# Patient Record
Sex: Male | Born: 1965 | Race: White | Hispanic: No | Smoking: Never smoker
Health system: Southern US, Community
[De-identification: ages and names within clinical notes are randomized; demographics above are authoritative.]

## PROBLEM LIST (undated history)

## (undated) DIAGNOSIS — G4700 Insomnia, unspecified: Secondary | ICD-10-CM

## (undated) DIAGNOSIS — R569 Unspecified convulsions: Secondary | ICD-10-CM

## (undated) DIAGNOSIS — M5126 Other intervertebral disc displacement, lumbar region: Secondary | ICD-10-CM

## (undated) DIAGNOSIS — M1990 Unspecified osteoarthritis, unspecified site: Secondary | ICD-10-CM

## (undated) DIAGNOSIS — Z87442 Personal history of urinary calculi: Secondary | ICD-10-CM

## (undated) HISTORY — PX: VASECTOMY: SHX75

## (undated) HISTORY — PX: COLONOSCOPY W/ BIOPSIES AND POLYPECTOMY: SHX1376

## (undated) HISTORY — PX: KNEE ARTHROSCOPY: SHX127

## (undated) HISTORY — PX: WISDOM TOOTH EXTRACTION: SHX21

## (undated) HISTORY — PX: LITHOTRIPSY: SUR834

---

## 2018-12-04 ENCOUNTER — Other Ambulatory Visit: Payer: Self-pay | Admitting: Orthopedic Surgery

## 2018-12-06 NOTE — Pre-Procedure Instructions (Signed)
[facility][id number] - [place], [place] - [address] AT [facility] [address] [place] [id number] Phone: [phone] Fax: [phone]      Your procedure is scheduled on Thursday, July 16th.  Report to [facility] Main Entrance "A" at 8:30 A.M., and check in at the Admitting office.  Call this number if you have problems the morning of surgery:  [phone]  Call [phone] if you have any questions prior to your surgery date Monday-Friday 8am-4pm    Remember:  Do not eat after midnight the night before your surgery  You may drink clear liquids until 8:30 the morning of your surgery.   Clear liquids allowed are: Water, Non-Citrus Juices (without pulp), Carbonated Beverages, Clear Tea, Black Coffee Only, and Gatorade.  Please complete your PRE-SURGERY ENSURE that was provided to you by 8:30 A.M. the morning of surgery.  Please, if able, drink it in one setting. DO NOT SIP.    Take these medicines the morning of surgery with A SIP OF WATER: NONE  fluticasone (FLONASE)-as needed  As of today, STOP taking any Aspirin (unless otherwise instructed by your surgeon), Aleve, Naproxen, Ibuprofen, Motrin, Advil, Goody's, BC's, all herbal medications, fish oil, and all vitamins.    The Morning of Surgery  Do not wear jewelry.  Do not wear lotions, powders, or colognes, or deodorant  Do not shave 48 hours prior to surgery.  Men may shave face and neck.  Do not bring valuables to the hospital.  [facility] is not responsible for any belongings or valuables.  If you are a smoker, DO NOT Smoke 24 hours prior to surgery IF you wear a CPAP at night please bring your mask, tubing, and machine the morning of surgery   Remember that you must have someone to transport you home after your surgery, and remain with you for 24 hours if you are discharged the same day.   Contacts, glasses, hearing aids, dentures or bridgework may not be worn into surgery.     Leave your suitcase in the car.  After surgery it may be brought to your room.  For patients admitted to the hospital, discharge time will be determined by your treatment team.  Patients discharged the day of surgery will not be allowed to drive home.    Special instructions:   [place]- Preparing For Surgery  Before surgery, you can play an important role. Because skin is not sterile, your skin needs to be as free of germs as possible. You can reduce the number of germs on your skin by washing with CHG (chlorahexidine gluconate) Soap before surgery.  CHG is an antiseptic cleaner which kills germs and bonds with the skin to continue killing germs even after washing.    Oral Hygiene is also important to reduce your risk of infection.  Remember - BRUSH YOUR TEETH THE MORNING OF SURGERY WITH YOUR REGULAR TOOTHPASTE  Please do not use if you have an allergy to CHG or antibacterial soaps. If your skin becomes reddened/irritated stop using the CHG.  Do not shave (including legs and underarms) for at least 48 hours prior to first CHG shower. It is OK to shave your face.  Please follow these instructions carefully.   1. Shower the NIGHT BEFORE SURGERY and the MORNING OF SURGERY with CHG Soap.   2. If you chose to wash your hair, wash your hair first as usual with your normal shampoo.  3.  After you shampoo, rinse your hair and body thoroughly to remove the shampoo.  4. Use CHG as you would any other liquid soap. You can apply CHG directly to the skin and wash gently with a scrungie or a clean washcloth.   5. Apply the CHG Soap to your body ONLY FROM THE NECK DOWN.  Do not use on open wounds or open sores. Avoid contact with your eyes, ears, mouth and genitals (private parts). Wash Face and genitals (private parts)  with your normal soap.   6. Wash thoroughly, paying special attention to the area where your surgery will be performed.  7. Thoroughly rinse your body with warm water from  the neck down.  8. DO NOT shower/wash with your normal soap after using and rinsing off the CHG Soap.  9. Pat yourself dry with a CLEAN TOWEL.  10. Wear CLEAN PAJAMAS to bed the night before surgery, wear comfortable clothes the morning of surgery  11. Place CLEAN SHEETS on your bed the night of your first shower and DO NOT SLEEP WITH PETS.    Day of Surgery:  Do not apply any deodorants/lotions. Please shower the morning of surgery with the CHG soap  Please wear clean clothes to the hospital/surgery center.   Remember to brush your teeth WITH YOUR REGULAR TOOTHPASTE.   Please read over the following fact sheets that you were given.

## 2018-12-09 ENCOUNTER — Encounter (HOSPITAL_COMMUNITY)
Admission: RE | Admit: 2018-12-09 | Discharge: 2018-12-09 | Disposition: A | Discharge status: Home / Self Care | Payer: Worker's Compensation | Source: Ambulatory Visit | Attending: Orthopedic Surgery | Admitting: Orthopedic Surgery

## 2018-12-09 ENCOUNTER — Encounter (HOSPITAL_COMMUNITY): Payer: Self-pay

## 2018-12-09 ENCOUNTER — Other Ambulatory Visit (HOSPITAL_COMMUNITY)
Admission: RE | Admit: 2018-12-09 | Discharge: 2018-12-09 | Disposition: A | Discharge status: Home / Self Care | Payer: Worker's Compensation | Source: Ambulatory Visit | Attending: Orthopedic Surgery | Admitting: Orthopedic Surgery

## 2018-12-09 ENCOUNTER — Other Ambulatory Visit: Payer: Self-pay

## 2018-12-09 DIAGNOSIS — M5117 Intervertebral disc disorders with radiculopathy, lumbosacral region: Secondary | ICD-10-CM | POA: Diagnosis not present

## 2018-12-09 DIAGNOSIS — Z86010 Personal history of colonic polyps: Secondary | ICD-10-CM | POA: Diagnosis not present

## 2018-12-09 DIAGNOSIS — G4700 Insomnia, unspecified: Secondary | ICD-10-CM | POA: Diagnosis not present

## 2018-12-09 DIAGNOSIS — F17290 Nicotine dependence, other tobacco product, uncomplicated: Secondary | ICD-10-CM | POA: Diagnosis not present

## 2018-12-09 DIAGNOSIS — Z79899 Other long term (current) drug therapy: Secondary | ICD-10-CM | POA: Diagnosis not present

## 2018-12-09 DIAGNOSIS — M5116 Intervertebral disc disorders with radiculopathy, lumbar region: Secondary | ICD-10-CM | POA: Diagnosis present

## 2018-12-09 DIAGNOSIS — M1990 Unspecified osteoarthritis, unspecified site: Secondary | ICD-10-CM | POA: Diagnosis not present

## 2018-12-09 DIAGNOSIS — Z1159 Encounter for screening for other viral diseases: Secondary | ICD-10-CM | POA: Insufficient documentation

## 2018-12-09 DIAGNOSIS — Z87442 Personal history of urinary calculi: Secondary | ICD-10-CM | POA: Diagnosis not present

## 2018-12-09 DIAGNOSIS — Z888 Allergy status to other drugs, medicaments and biological substances status: Secondary | ICD-10-CM | POA: Diagnosis not present

## 2018-12-09 DIAGNOSIS — R569 Unspecified convulsions: Secondary | ICD-10-CM | POA: Diagnosis not present

## 2018-12-09 DIAGNOSIS — Z01812 Encounter for preprocedural laboratory examination: Secondary | ICD-10-CM | POA: Insufficient documentation

## 2018-12-09 DIAGNOSIS — Z809 Family history of malignant neoplasm, unspecified: Secondary | ICD-10-CM | POA: Diagnosis not present

## 2018-12-09 HISTORY — DX: Other intervertebral disc displacement, lumbar region: M51.26

## 2018-12-09 HISTORY — DX: Personal history of urinary calculi: Z87.442

## 2018-12-09 HISTORY — DX: Unspecified osteoarthritis, unspecified site: M19.90

## 2018-12-09 HISTORY — DX: Unspecified convulsions: R56.9

## 2018-12-09 HISTORY — DX: Insomnia, unspecified: G47.00

## 2018-12-09 LAB — COMPREHENSIVE METABOLIC PANEL
ALT: 23 U/L (ref 0–44)
AST: 18 U/L (ref 15–41)
Albumin: 4.4 g/dL (ref 3.5–5.0)
Alkaline Phosphatase: 51 U/L (ref 38–126)
Anion gap: 9 (ref 5–15)
BUN: 14 mg/dL (ref 6–20)
CO2: 27 mmol/L (ref 22–32)
Calcium: 9.2 mg/dL (ref 8.9–10.3)
Chloride: 102 mmol/L (ref 98–111)
Creatinine, Ser: 1.06 mg/dL (ref 0.61–1.24)
GFR calc Af Amer: >60 mL/min (ref >60)
GFR calc non Af Amer: >60 mL/min (ref >60)
Glucose, Bld: 100 mg/dL — ABNORMAL HIGH (ref 70–99)
Potassium: 4.3 mmol/L (ref 3.5–5.1)
Sodium: 138 mmol/L (ref 135–145)
Total Bilirubin: 0.8 mg/dL (ref 0.3–1.2)
Total Protein: 7.2 g/dL (ref 6.5–8.1)

## 2018-12-09 LAB — CBC WITH DIFFERENTIAL/PLATELET
Abs Immature Granulocytes: 0.01 10*3/uL (ref 0.00–0.07)
Basophils Absolute: 0 10*3/uL (ref 0.0–0.1)
Basophils Relative: 0 %
Eosinophils Absolute: 0.1 10*3/uL (ref 0.0–0.5)
Eosinophils Relative: 2 %
HCT: 46.8 % (ref 39.0–52.0)
Hemoglobin: 15.5 g/dL (ref 13.0–17.0)
Immature Granulocytes: 0 %
Lymphocytes Relative: 27 %
Lymphs Abs: 1.3 10*3/uL (ref 0.7–4.0)
MCH: 29.4 pg (ref 26.0–34.0)
MCHC: 33.1 g/dL (ref 30.0–36.0)
MCV: 88.8 fL (ref 80.0–100.0)
Monocytes Absolute: 0.3 10*3/uL (ref 0.1–1.0)
Monocytes Relative: 7 %
Neutro Abs: 2.9 10*3/uL (ref 1.7–7.7)
Neutrophils Relative %: 64 %
Platelets: 175 10*3/uL (ref 150–400)
RBC: 5.27 MIL/uL (ref 4.22–5.81)
RDW: 12.5 % (ref 11.5–15.5)
WBC: 4.7 10*3/uL (ref 4.0–10.5)
nRBC: 0 % (ref 0.0–0.2)

## 2018-12-09 LAB — TYPE AND SCREEN
ABO/RH(D): A POS
Antibody Screen: NEGATIVE

## 2018-12-09 LAB — URINALYSIS, ROUTINE W REFLEX MICROSCOPIC
Bilirubin Urine: NEGATIVE
Glucose, UA: NEGATIVE mg/dL
Hgb urine dipstick: NEGATIVE
Ketones, ur: NEGATIVE mg/dL
Leukocytes,Ua: NEGATIVE
Nitrite: NEGATIVE
Protein, ur: NEGATIVE mg/dL
Specific Gravity, Urine: 1.021 (ref 1.005–1.030)
pH: 6 (ref 5.0–8.0)

## 2018-12-09 LAB — ABO/RH: ABO/RH(D): A POS

## 2018-12-09 LAB — PROTIME-INR
INR: 1.1 (ref 0.8–1.2)
Prothrombin Time: 13.9 seconds (ref 11.4–15.2)

## 2018-12-09 LAB — SURGICAL PCR SCREEN
MRSA, PCR: NEGATIVE
Staphylococcus aureus: POSITIVE — AB

## 2018-12-09 LAB — APTT: aPTT: 32 seconds (ref 24–36)

## 2018-12-09 NOTE — Progress Notes (Signed)
Pt denies SOB, chest pain, and being under the care of a cardiologist. Pt denies having a stress test, echo and cardiac cath. Pt denies having an EKG and chest x ray within the last year. Pt denies recent labs. Pt stated that he is under the care of Dr. Jackelyn Poling, Neurology, at [facility] ([facility]) and Dr. Algernon Huxley, PCP.  Pt denies that he and family members tested positive for COVID-19 ( Pt stated that he is scheduled to be tested 12/09/18 and reminded to quarantine ).   Coronavirus Screening  Pt denies that he and family experienced the following symptoms:  Cough yes/no: No Fever (>100.2F)  yes/no: No Runny nose yes/no: No Sore throat yes/no: No Difficulty breathing/shortness of breath  yes/no: No  Have you or a family member traveled in the last 14 days and where? yes/no: No  Pt reminded that hospital visitation restrictions are in effect and the importance of the restrictions.   Pt verbalized understanding of all pre-op instructions.

## 2018-12-10 LAB — SARS CORONAVIRUS 2 (TAT 6-24 HRS): SARS Coronavirus 2: NEGATIVE

## 2018-12-12 ENCOUNTER — Encounter (HOSPITAL_COMMUNITY): Payer: Self-pay

## 2018-12-12 ENCOUNTER — Ambulatory Visit (HOSPITAL_COMMUNITY)
Admission: RE | Admit: 2018-12-12 | Discharge: 2018-12-12 | Disposition: A | Discharge status: Home / Self Care | Payer: Worker's Compensation | Source: Ambulatory Visit | Attending: Orthopedic Surgery | Admitting: Orthopedic Surgery

## 2018-12-12 ENCOUNTER — Ambulatory Visit (HOSPITAL_COMMUNITY): Payer: Worker's Compensation | Admitting: Certified Registered"

## 2018-12-12 ENCOUNTER — Other Ambulatory Visit: Payer: Self-pay

## 2018-12-12 ENCOUNTER — Ambulatory Visit (HOSPITAL_COMMUNITY)
Admission: RE | Admit: 2018-12-12 | Discharge: 2018-12-12 | Disposition: A | Discharge status: Home / Self Care | Payer: Worker's Compensation | Attending: Orthopedic Surgery | Admitting: Orthopedic Surgery

## 2018-12-12 ENCOUNTER — Ambulatory Visit (HOSPITAL_COMMUNITY): Payer: Worker's Compensation

## 2018-12-12 ENCOUNTER — Encounter (HOSPITAL_COMMUNITY)
Admission: RE | Disposition: A | Discharge status: Home / Self Care | Payer: Self-pay | Source: Home / Self Care | Attending: Orthopedic Surgery

## 2018-12-12 DIAGNOSIS — Z419 Encounter for procedure for purposes other than remedying health state, unspecified: Secondary | ICD-10-CM

## 2018-12-12 DIAGNOSIS — M1990 Unspecified osteoarthritis, unspecified site: Secondary | ICD-10-CM | POA: Insufficient documentation

## 2018-12-12 DIAGNOSIS — Z86010 Personal history of colonic polyps: Secondary | ICD-10-CM | POA: Insufficient documentation

## 2018-12-12 DIAGNOSIS — Z888 Allergy status to other drugs, medicaments and biological substances status: Secondary | ICD-10-CM | POA: Insufficient documentation

## 2018-12-12 DIAGNOSIS — F17290 Nicotine dependence, other tobacco product, uncomplicated: Secondary | ICD-10-CM | POA: Insufficient documentation

## 2018-12-12 DIAGNOSIS — M5117 Intervertebral disc disorders with radiculopathy, lumbosacral region: Secondary | ICD-10-CM | POA: Insufficient documentation

## 2018-12-12 DIAGNOSIS — M5410 Radiculopathy, site unspecified: Secondary | ICD-10-CM | POA: Diagnosis present

## 2018-12-12 DIAGNOSIS — R569 Unspecified convulsions: Secondary | ICD-10-CM | POA: Insufficient documentation

## 2018-12-12 DIAGNOSIS — Z87442 Personal history of urinary calculi: Secondary | ICD-10-CM | POA: Diagnosis not present

## 2018-12-12 DIAGNOSIS — Z809 Family history of malignant neoplasm, unspecified: Secondary | ICD-10-CM | POA: Insufficient documentation

## 2018-12-12 DIAGNOSIS — G4700 Insomnia, unspecified: Secondary | ICD-10-CM | POA: Insufficient documentation

## 2018-12-12 DIAGNOSIS — Z1159 Encounter for screening for other viral diseases: Secondary | ICD-10-CM | POA: Insufficient documentation

## 2018-12-12 DIAGNOSIS — Z79899 Other long term (current) drug therapy: Secondary | ICD-10-CM | POA: Insufficient documentation

## 2018-12-12 HISTORY — PX: LUMBAR LAMINECTOMY/DECOMPRESSION MICRODISCECTOMY: SHX5026

## 2018-12-12 SURGERY — LUMBAR LAMINECTOMY/DECOMPRESSION MICRODISCECTOMY
Anesthesia: General | Site: Spine Lumbar

## 2018-12-12 MED ORDER — FENTANYL CITRATE (PF) 250 MCG/5ML IJ SOLN
INTRAMUSCULAR | Status: AC
  Filled 2018-12-12: qty 5

## 2018-12-12 MED ORDER — BUPIVACAINE-EPINEPHRINE 0.25% -1:200000 IJ SOLN
INTRAMUSCULAR | Status: DC | PRN
  Administered 2018-12-12: 6 mL

## 2018-12-12 MED ORDER — SODIUM CHLORIDE 0.9 % IV SOLN
INTRAVENOUS | Status: DC | PRN
  Administered 2018-12-12: 25 ug/min via INTRAVENOUS

## 2018-12-12 MED ORDER — SUGAMMADEX SODIUM 200 MG/2ML IV SOLN
INTRAVENOUS | Status: DC | PRN
  Administered 2018-12-12: 200 mg via INTRAVENOUS

## 2018-12-12 MED ORDER — HEMOSTATIC AGENTS (NO CHARGE) OPTIME
TOPICAL | Status: DC | PRN
  Administered 2018-12-12 (×2): 1

## 2018-12-12 MED ORDER — PROPOFOL 10 MG/ML IV BOLUS
INTRAVENOUS | Status: DC | PRN
  Administered 2018-12-12: 200 mg via INTRAVENOUS
  Administered 2018-12-12: 20 mg via INTRAVENOUS

## 2018-12-12 MED ORDER — EPHEDRINE SULFATE-NACL 50-0.9 MG/10ML-% IV SOSY
PREFILLED_SYRINGE | INTRAVENOUS | Status: DC | PRN
  Administered 2018-12-12: 5 mg via INTRAVENOUS

## 2018-12-12 MED ORDER — FENTANYL CITRATE (PF) 250 MCG/5ML IJ SOLN
INTRAMUSCULAR | Status: DC | PRN
  Administered 2018-12-12 (×4): 50 ug via INTRAVENOUS
  Administered 2018-12-12: 100 ug via INTRAVENOUS
  Administered 2018-12-12: 50 ug via INTRAVENOUS

## 2018-12-12 MED ORDER — SCOPOLAMINE 1 MG/3DAYS TD PT72
1.0000 | MEDICATED_PATCH | TRANSDERMAL | Status: DC
  Administered 2018-12-12: 1.5 mg via TRANSDERMAL
  Filled 2018-12-12: qty 1

## 2018-12-12 MED ORDER — LACTATED RINGERS IV SOLN
INTRAVENOUS | Status: DC | PRN
  Administered 2018-12-12 (×2): via INTRAVENOUS

## 2018-12-12 MED ORDER — FENTANYL CITRATE (PF) 100 MCG/2ML IJ SOLN
INTRAMUSCULAR | Status: AC
  Filled 2018-12-12: qty 2

## 2018-12-12 MED ORDER — METHOCARBAMOL 500 MG PO TABS
ORAL_TABLET | ORAL | Status: AC
  Filled 2018-12-12: qty 1

## 2018-12-12 MED ORDER — METHOCARBAMOL 500 MG PO TABS
500.0000 mg | ORAL_TABLET | Freq: Once | ORAL | Status: AC
  Administered 2018-12-12: 500 mg via ORAL

## 2018-12-12 MED ORDER — METHYLPREDNISOLONE ACETATE 40 MG/ML IJ SUSP
INTRAMUSCULAR | Status: DC | PRN
  Administered 2018-12-12: 40 mg

## 2018-12-12 MED ORDER — METHYLENE BLUE 0.5 % INJ SOLN
INTRAVENOUS | Status: DC | PRN
  Administered 2018-12-12: 1 mL

## 2018-12-12 MED ORDER — POVIDONE-IODINE 7.5 % EX SOLN: Freq: Once | CUTANEOUS | Status: DC

## 2018-12-12 MED ORDER — METHYLPREDNISOLONE ACETATE 80 MG/ML IJ SUSP
INTRAMUSCULAR | Status: AC
  Filled 2018-12-12: qty 1

## 2018-12-12 MED ORDER — ACETAMINOPHEN 500 MG PO TABS
1000.0000 mg | ORAL_TABLET | Freq: Once | ORAL | Status: AC
  Administered 2018-12-12: 1000 mg via ORAL
  Filled 2018-12-12: qty 2

## 2018-12-12 MED ORDER — METHYLENE BLUE 0.5 % INJ SOLN
INTRAVENOUS | Status: AC
  Filled 2018-12-12: qty 10

## 2018-12-12 MED ORDER — EPHEDRINE 5 MG/ML INJ
INTRAVENOUS | Status: AC
  Filled 2018-12-12: qty 10

## 2018-12-12 MED ORDER — PROPOFOL 10 MG/ML IV BOLUS
INTRAVENOUS | Status: AC
  Filled 2018-12-12: qty 20

## 2018-12-12 MED ORDER — ONDANSETRON HCL 4 MG/2ML IJ SOLN
INTRAMUSCULAR | Status: DC | PRN
  Administered 2018-12-12: 4 mg via INTRAVENOUS

## 2018-12-12 MED ORDER — MIDAZOLAM HCL 5 MG/5ML IJ SOLN
INTRAMUSCULAR | Status: DC | PRN
  Administered 2018-12-12: 2 mg via INTRAVENOUS

## 2018-12-12 MED ORDER — 0.9 % SODIUM CHLORIDE (POUR BTL) OPTIME
TOPICAL | Status: DC | PRN
  Administered 2018-12-12: 1000 mL

## 2018-12-12 MED ORDER — SUCCINYLCHOLINE CHLORIDE 20 MG/ML IJ SOLN
INTRAMUSCULAR | Status: DC | PRN
  Administered 2018-12-12: 120 mg via INTRAVENOUS

## 2018-12-12 MED ORDER — DIAZEPAM 5 MG PO TABS: 5.0000 mg | ORAL_TABLET | Freq: Three times a day (TID) | ORAL | 0 refills | Status: AC | PRN

## 2018-12-12 MED ORDER — LIDOCAINE 2% (20 MG/ML) 5 ML SYRINGE
INTRAMUSCULAR | Status: DC | PRN
  Administered 2018-12-12: 100 mg via INTRAVENOUS

## 2018-12-12 MED ORDER — PROMETHAZINE HCL 25 MG/ML IJ SOLN: 6.2500 mg | INTRAMUSCULAR | Status: DC | PRN

## 2018-12-12 MED ORDER — CEFAZOLIN SODIUM-DEXTROSE 2-4 GM/100ML-% IV SOLN
2.0000 g | INTRAVENOUS | Status: AC
  Administered 2018-12-12: 2 g via INTRAVENOUS
  Filled 2018-12-12: qty 100

## 2018-12-12 MED ORDER — FENTANYL CITRATE (PF) 100 MCG/2ML IJ SOLN
25.0000 ug | INTRAMUSCULAR | Status: DC | PRN
  Administered 2018-12-12: 50 ug via INTRAVENOUS

## 2018-12-12 MED ORDER — THROMBIN 20000 UNITS EX SOLR
CUTANEOUS | Status: DC | PRN
  Administered 2018-12-12: 20 mL

## 2018-12-12 MED ORDER — BUPIVACAINE-EPINEPHRINE (PF) 0.25% -1:200000 IJ SOLN
INTRAMUSCULAR | Status: AC
  Filled 2018-12-12: qty 30

## 2018-12-12 MED ORDER — DEXAMETHASONE SODIUM PHOSPHATE 10 MG/ML IJ SOLN
INTRAMUSCULAR | Status: DC | PRN
  Administered 2018-12-12: 10 mg via INTRAVENOUS

## 2018-12-12 MED ORDER — SUCCINYLCHOLINE CHLORIDE 200 MG/10ML IV SOSY
PREFILLED_SYRINGE | INTRAVENOUS | Status: AC
  Filled 2018-12-12: qty 30

## 2018-12-12 MED ORDER — BUPIVACAINE LIPOSOME 1.3 % IJ SUSP
20.0000 mL | Freq: Once | INTRAMUSCULAR | Status: DC
  Filled 2018-12-12: qty 20

## 2018-12-12 MED ORDER — THROMBIN 5000 UNITS EX SOLR
CUTANEOUS | Status: AC
  Filled 2018-12-12: qty 10000

## 2018-12-12 MED ORDER — PHENYLEPHRINE 40 MCG/ML (10ML) SYRINGE FOR IV PUSH (FOR BLOOD PRESSURE SUPPORT)
PREFILLED_SYRINGE | INTRAVENOUS | Status: AC
  Filled 2018-12-12: qty 10

## 2018-12-12 MED ORDER — DEXAMETHASONE SODIUM PHOSPHATE 10 MG/ML IJ SOLN
INTRAMUSCULAR | Status: AC
  Filled 2018-12-12: qty 2

## 2018-12-12 MED ORDER — MIDAZOLAM HCL 2 MG/2ML IJ SOLN
INTRAMUSCULAR | Status: AC
  Filled 2018-12-12: qty 2

## 2018-12-12 MED ORDER — ROCURONIUM BROMIDE 10 MG/ML (PF) SYRINGE
PREFILLED_SYRINGE | INTRAVENOUS | Status: AC
  Filled 2018-12-12: qty 20

## 2018-12-12 MED ORDER — LIDOCAINE 2% (20 MG/ML) 5 ML SYRINGE
INTRAMUSCULAR | Status: AC
  Filled 2018-12-12: qty 10

## 2018-12-12 MED ORDER — ONDANSETRON HCL 4 MG/2ML IJ SOLN
INTRAMUSCULAR | Status: AC
  Filled 2018-12-12: qty 4

## 2018-12-12 MED ORDER — ROCURONIUM BROMIDE 10 MG/ML (PF) SYRINGE
PREFILLED_SYRINGE | INTRAVENOUS | Status: DC | PRN
  Administered 2018-12-12: 20 mg via INTRAVENOUS
  Administered 2018-12-12: 50 mg via INTRAVENOUS
  Administered 2018-12-12: 20 mg via INTRAVENOUS

## 2018-12-12 MED ORDER — OXYCODONE-ACETAMINOPHEN 5-325 MG PO TABS: 1.0000 | ORAL_TABLET | ORAL | 0 refills | Status: AC | PRN

## 2018-12-12 MED ORDER — BUPIVACAINE LIPOSOME 1.3 % IJ SUSP
INTRAMUSCULAR | Status: DC | PRN
  Administered 2018-12-12: 20 mL

## 2018-12-12 MED ORDER — THROMBIN 20000 UNITS EX SOLR
CUTANEOUS | Status: AC
  Filled 2018-12-12: qty 20000

## 2018-12-12 SURGICAL SUPPLY — 76 items
BENZOIN TINCTURE PRP APPL 2/3 (GAUZE/BANDAGES/DRESSINGS) ×3 IMPLANT
BUR MATCHSTICK NEURO 3.0 LAGG (BURR) ×3 IMPLANT
BUR PRECISION FLUTE 5.0 (BURR) ×3 IMPLANT
CABLE BIPOLOR RESECTION CORD (MISCELLANEOUS) ×3 IMPLANT
CANISTER SUCT 3000ML PPV (MISCELLANEOUS) ×3 IMPLANT
CARTRIDGE OIL MAESTRO DRILL (MISCELLANEOUS) ×1 IMPLANT
CLOSURE STERI-STRIP 1/4X4 (GAUZE/BANDAGES/DRESSINGS) ×3 IMPLANT
CLOSURE WOUND 1/2 X4 (GAUZE/BANDAGES/DRESSINGS) ×1
CONT SPEC 4OZ CLIKSEAL STRL BL (MISCELLANEOUS) ×3 IMPLANT
COVER MAYO STAND STRL (DRAPES) ×3 IMPLANT
COVER SURGICAL LIGHT HANDLE (MISCELLANEOUS) IMPLANT
COVER WAND RF STERILE (DRAPES) IMPLANT
DIFFUSER DRILL AIR PNEUMATIC (MISCELLANEOUS) ×3 IMPLANT
DRAIN CHANNEL 15F RND FF W/TCR (WOUND CARE) IMPLANT
DRAPE C-ARM 42X72 X-RAY (DRAPES) ×3 IMPLANT
DRAPE C-ARMOR (DRAPES) ×3 IMPLANT
DRAPE POUCH INSTRU U-SHP 10X18 (DRAPES) ×6 IMPLANT
DRAPE SURG 17X23 STRL (DRAPES) ×12 IMPLANT
DURAPREP 26ML APPLICATOR (WOUND CARE) ×3 IMPLANT
ELECT BLADE 4.0 EZ CLEAN MEGAD (MISCELLANEOUS) ×3
ELECT CAUTERY BLADE 6.4 (BLADE) ×3 IMPLANT
ELECT REM PT RETURN 9FT ADLT (ELECTROSURGICAL) ×3
ELECTRODE BLDE 4.0 EZ CLN MEGD (MISCELLANEOUS) ×1 IMPLANT
ELECTRODE REM PT RTRN 9FT ADLT (ELECTROSURGICAL) ×1 IMPLANT
EVACUATOR SILICONE 100CC (DRAIN) IMPLANT
FILTER STRAW FLUID ASPIR (MISCELLANEOUS) ×3 IMPLANT
GAUZE 4X4 16PLY RFD (DISPOSABLE) ×6 IMPLANT
GAUZE SPONGE 4X4 12PLY STRL (GAUZE/BANDAGES/DRESSINGS) ×3 IMPLANT
GLOVE BIO SURGEON STRL SZ7 (GLOVE) ×6 IMPLANT
GLOVE BIO SURGEON STRL SZ8 (GLOVE) ×3 IMPLANT
GLOVE BIOGEL PI IND STRL 7.0 (GLOVE) ×2 IMPLANT
GLOVE BIOGEL PI IND STRL 8 (GLOVE) ×1 IMPLANT
GLOVE BIOGEL PI INDICATOR 7.0 (GLOVE) ×4
GLOVE BIOGEL PI INDICATOR 8 (GLOVE) ×2
GOWN STRL REUS W/ TWL LRG LVL3 (GOWN DISPOSABLE) ×5 IMPLANT
GOWN STRL REUS W/ TWL XL LVL3 (GOWN DISPOSABLE) ×1 IMPLANT
GOWN STRL REUS W/TWL LRG LVL3 (GOWN DISPOSABLE) ×10
GOWN STRL REUS W/TWL XL LVL3 (GOWN DISPOSABLE) ×2
IV CATH 14GX2 1/4 (CATHETERS) ×3 IMPLANT
KIT BASIN OR (CUSTOM PROCEDURE TRAY) ×3 IMPLANT
KIT POSITION SURG JACKSON T1 (MISCELLANEOUS) ×3 IMPLANT
KIT TURNOVER KIT B (KITS) ×3 IMPLANT
MARKER SKIN DUAL TIP RULER LAB (MISCELLANEOUS) ×3 IMPLANT
NEEDLE 18GX1X1/2 (RX/OR ONLY) (NEEDLE) ×3 IMPLANT
NEEDLE 22X1 1/2 (OR ONLY) (NEEDLE) ×3 IMPLANT
NEEDLE HYPO 25GX1X1/2 BEV (NEEDLE) ×3 IMPLANT
NEEDLE SPNL 18GX3.5 QUINCKE PK (NEEDLE) ×6 IMPLANT
NS IRRIG 1000ML POUR BTL (IV SOLUTION) ×3 IMPLANT
OIL CARTRIDGE MAESTRO DRILL (MISCELLANEOUS) ×3
PACK LAMINECTOMY ORTHO (CUSTOM PROCEDURE TRAY) ×3 IMPLANT
PACK UNIVERSAL I (CUSTOM PROCEDURE TRAY) ×3 IMPLANT
PAD ARMBOARD 7.5X6 YLW CONV (MISCELLANEOUS) ×6 IMPLANT
PATTIES SURGICAL .5 X.5 (GAUZE/BANDAGES/DRESSINGS) IMPLANT
PATTIES SURGICAL .5 X1 (DISPOSABLE) ×3 IMPLANT
SPONGE INTESTINAL PEANUT (DISPOSABLE) ×3 IMPLANT
SPONGE SURGIFOAM ABS GEL 100 (HEMOSTASIS) ×3 IMPLANT
SPONGE SURGIFOAM ABS GEL SZ50 (HEMOSTASIS) ×3 IMPLANT
STRIP CLOSURE SKIN 1/2X4 (GAUZE/BANDAGES/DRESSINGS) ×2 IMPLANT
SURGIFLO W/THROMBIN 8M KIT (HEMOSTASIS) ×3 IMPLANT
SUT MNCRL AB 4-0 PS2 18 (SUTURE) ×3 IMPLANT
SUT VIC AB 0 CT1 18XCR BRD 8 (SUTURE) IMPLANT
SUT VIC AB 0 CT1 27 (SUTURE)
SUT VIC AB 0 CT1 27XBRD ANBCTR (SUTURE) IMPLANT
SUT VIC AB 0 CT1 8-18 (SUTURE)
SUT VIC AB 1 CT1 18XCR BRD 8 (SUTURE) ×1 IMPLANT
SUT VIC AB 1 CT1 8-18 (SUTURE) ×2
SUT VIC AB 2-0 CT2 18 VCP726D (SUTURE) ×3 IMPLANT
SYR 20CC LL (SYRINGE) ×3 IMPLANT
SYR BULB IRRIGATION 50ML (SYRINGE) ×3 IMPLANT
SYR CONTROL 10ML LL (SYRINGE) ×9 IMPLANT
SYR TB 1ML 25GX5/8 (SYRINGE) ×6 IMPLANT
SYR TB 1ML LUER SLIP (SYRINGE) ×6 IMPLANT
TOWEL GREEN STERILE (TOWEL DISPOSABLE) ×3 IMPLANT
TOWEL GREEN STERILE FF (TOWEL DISPOSABLE) ×3 IMPLANT
WATER STERILE IRR 1000ML POUR (IV SOLUTION) ×3 IMPLANT
YANKAUER SUCT BULB TIP NO VENT (SUCTIONS) ×3 IMPLANT

## 2018-12-12 NOTE — Op Note (Signed)
PATIENT NAME: Alejandro Allen   MEDICAL RECORD NO.:   [id number]    PHYSICIAN:  Phylliss Bob, MD      DATE OF BIRTH: 12/13/2016  ASSISTANT: Pricilla Holm, PA-C   DATE OF PROCEDURE: 12/12/2018                              OPERATIVE REPORT     PREOPERATIVE DIAGNOSES: 1. Right-sided L4 radiculopathy. 2. 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   POSTOPERATIVE DIAGNOSES: 1. Right-sided S1 radiculopathy. 2. Large right-sided L5-S1 disk herniation causing severe     compression of the right S1 nerve.   PROCEDURES:  Right-sided L5-S1 laminotomy with partial facetectomy and removal of large herniated right-sided L5-S1 disk fragment.   SURGEON:  Phylliss Bob, MD.   ASSISTANTPricilla Holm, PA-C.   ANESTHESIA:  General endotracheal anesthesia.   COMPLICATIONS:  None.   DISPOSITION:  Stable.   ESTIMATED BLOOD LOSS:  Minimal.   INDICATIONS FOR SURGERY:  Briefly, Alejandro Allen, Alejandro Allen is a pleasant 53 year old male, who did present to me with severe pain in the right Leg s/p a work injury that occurred on 10/17/2018.  The patient's MRI did reveal the findings outlined above, clearly notable for a large herniated disk fragment. The pain was rather severe and he did have weakness in his leg as well.  We did discuss treatment options and we did ultimately elect to proceed with the procedure reflected above.  The patient was fully made aware of the risks of surgery, including the risk of recurrent herniation and the need for subsequent surgery, including the possibility of a subsequent diskectomy and/or fusion.   OPERATIVE DETAILS:  On 12/12/2018, the patient was brought to surgery and general endotracheal anesthesia was administered.  The patient was placed prone on a well-padded flat Jackson bed with a spinal frame.  Antibiotics were given.  The back was prepped and draped and a time-out procedure was performed.  At this point, a right  sided paramedian incision was made directly over the L4-5 intervertebral space.  An  incision was made into the fascia, and the Wiltsie plane was identified.  A self-retaining Thompson retractor was placed over the lateral aspect of the L4 lamina.  Using a high-speed bur and Kerrison punches, I did remove the lateral aspect of the L4-5 facet joint and the lateral aspect of the L4 pars interarticularis.  Ligamentum flavum was identified, and the lateral aspect of it was removed.  The exiting L4 nerve was readily noted.  Using a [facility], I did superiorly and laterally retract the L4 nerve, and readily noted was a large extruded disc fragment located behind the L4 vertebral body, just above the intervertebral space.  With gentle safe superior and lateral retraction of the nerve, I did remove the extruded fragments and 3 rather large pieces.  Upon additional exploration of the foraminal extraforaminal region, I was able to confirm that there was no additional compression of the nerve. I was very pleased with the final decompression that I was able to accomplish.  At this point, the wound was copiously irrigated with normal saline.  All epidural bleeding was controlled using bipolar electrocautery in addition to Surgiflo. All bleeding was controlled at the termination of the procedure.  At this point, 40 mg of Depo-Medrol was introduced about the epidural space in the region of the right L4 nerve.  The wound was then closed in layers using #1 Vicryl followed by 0 Vicryl, followed by 4-0 Monocryl. Benzoin and Steri-Strips were applied followed by a sterile dressing. All instrument counts were correct at the termination of the procedure.   Of note, Jason CoopKayla McKenzie was my assistant throughout surgery, and did aid in retraction, suctioning, and closure from start to finish.         Estill BambergMark Devanee Pomplun, MD

## 2018-12-12 NOTE — Anesthesia Preprocedure Evaluation (Addendum)
Anesthesia Evaluation  Patient identified by MRN, date of birth, ID band Patient awake    Reviewed: Allergy & Precautions, NPO status , Patient's Chart, lab work & pertinent test results  History of Anesthesia Complications Negative for: history of anesthetic complications  Airway Mallampati: II  TM Distance: >3 FB Neck ROM: Full    Dental no notable dental hx. (+) Dental Advisory Given   Pulmonary neg pulmonary ROS,    Pulmonary exam normal        Cardiovascular negative cardio ROS Normal cardiovascular exam     Neuro/Psych Seizures -, Well Controlled,     GI/Hepatic negative GI ROS, Neg liver ROS,   Endo/Other  negative endocrine ROS  Renal/GU negative Renal ROS     Musculoskeletal negative musculoskeletal ROS (+)   Abdominal   Peds  Hematology negative hematology ROS (+)   Anesthesia Other Findings Day of surgery medications reviewed with the patient.  Reproductive/Obstetrics                           Anesthesia Physical Anesthesia Plan  ASA: II  Anesthesia Plan: General   Post-op Pain Management:    Induction: Intravenous  PONV Risk Score and Plan: 3 and Ondansetron, Dexamethasone and Scopolamine patch - Pre-op  Airway Management Planned: Oral ETT  Additional Equipment:   Intra-op Plan:   Post-operative Plan: Extubation in OR  Informed Consent: I have reviewed the patients History and Physical, chart, labs and discussed the procedure including the risks, benefits and alternatives for the proposed anesthesia with the patient or authorized representative who has indicated his/her understanding and acceptance.     Dental advisory given  Plan Discussed with: CRNA and Anesthesiologist  Anesthesia Plan Comments:        Anesthesia Quick Evaluation

## 2018-12-12 NOTE — Transfer of Care (Signed)
Immediate Anesthesia Transfer of Care Note  Patient: Alejandro Allen  Procedure(s) Performed: LUMBAR FOUR-FIVE DECOMPRESSION (N/A Spine Lumbar)  Patient Location: PACU  Anesthesia Type:General  Level of Consciousness: awake, alert  and oriented  Airway & Oxygen Therapy: Patient Spontanous Breathing and Patient connected to face mask oxygen  Post-op Assessment: Report given to RN, Post -op Vital signs reviewed and stable and Patient moving all extremities X 4  Post vital signs: Reviewed and stable  Last Vitals:  Vitals Value Taken Time  BP 127/65 12/12/18 1415  Temp    Pulse 76 12/12/18 1415  Resp 29 12/12/18 1415  SpO2 100 % 12/12/18 1415  Vitals shown include unvalidated device data.  Last Pain:  Vitals:   12/12/18 0851  TempSrc: Oral  PainSc: 7       Patients Stated Pain Goal: 0 (83/38/25 0539)  Complications: No apparent anesthesia complications

## 2018-12-12 NOTE — Anesthesia Procedure Notes (Signed)
Procedure Name: Intubation Date/Time: 12/12/2018 11:36 AM Performed by: Gaylene Brooks, CRNA Pre-anesthesia Checklist: Patient identified, Emergency Drugs available, Suction available and Patient being monitored Patient Re-evaluated:Patient Re-evaluated prior to induction Oxygen Delivery Method: Circle System Utilized Preoxygenation: Pre-oxygenation with 100% oxygen Induction Type: IV induction Laryngoscope Size: Miller and 2 Grade View: Grade II Tube type: Oral Tube size: 7.5 mm Number of attempts: 1 Airway Equipment and Method: Stylet and Oral airway Placement Confirmation: ETT inserted through vocal cords under direct vision,  positive ETCO2 and breath sounds checked- equal and bilateral Secured at: 23 cm Tube secured with: Tape Dental Injury: Teeth and Oropharynx as per pre-operative assessment

## 2018-12-12 NOTE — Anesthesia Postprocedure Evaluation (Signed)
Anesthesia Post Note  Patient: Alejandro Allen  Procedure(s) Performed: LUMBAR FOUR-FIVE DECOMPRESSION (N/A Spine Lumbar)     Patient location during evaluation: PACU Anesthesia Type: General Level of consciousness: sedated Pain management: pain level controlled Vital Signs Assessment: post-procedure vital signs reviewed and stable Respiratory status: spontaneous breathing and respiratory function stable Cardiovascular status: stable Postop Assessment: no apparent nausea or vomiting Anesthetic complications: no    Last Vitals:  Vitals:   12/12/18 1445 12/12/18 1500  BP: 130/80 126/78  Pulse: 80 78  Resp: 19 15  Temp:  (!) 36.2 C  SpO2: 97% 95%    Last Pain:  Vitals:   12/12/18 1445  TempSrc:   PainSc: 7                  Ayisha Pol DANIEL

## 2018-12-12 NOTE — H&P (Addendum)
PREOPERATIVE H&P  Chief Complaint: Right leg pain  HPI: Alejandro Allen is a 53 y.o. male who presents with ongoing pain in the right leg  MRI reveals a large foraminal disc herniation at L4/5  Patient has failed multiple forms of conservative care and continues to have pain (see office notes for additional details regarding the patient's full course of treatment)  Past Medical History:  Diagnosis Date  . Arthritis   . History of kidney stones   . Insomnia   . Lumbar disc herniation    foraminal, right lumbar 4-5  . Seizures (HCC)    Past Surgical History:  Procedure Laterality Date  . COLONOSCOPY W/ BIOPSIES AND POLYPECTOMY    . KNEE ARTHROSCOPY     left  . LITHOTRIPSY    . VASECTOMY    . WISDOM TOOTH EXTRACTION     Social History   Socioeconomic History  . Marital status: Legally Separated    Spouse name: Not on file  . Number of children: Not on file  . Years of education: Not on file  . Highest education level: Not on file  Occupational History  . Not on file  Social Needs  . Financial resource strain: Not on file  . Food insecurity    Worry: Not on file    Inability: Not on file  . Transportation needs    Medical: Not on file    Non-medical: Not on file  Tobacco Use  . Smoking status: Never Smoker  . Smokeless tobacco: Current User    Types: Chew  Substance and Sexual Activity  . Alcohol use: Yes    Comment: occasional  . Drug use: Never  . Sexual activity: Yes    Birth control/protection: Surgical  Lifestyle  . Physical activity    Days per week: Not on file    Minutes per session: Not on file  . Stress: Not on file  Relationships  . Social Musicianconnections    Talks on phone: Not on file    Gets together: Not on file    Attends religious service: Not on file    Active member of club or organization: Not on file    Attends meetings of clubs or organizations: Not on file    Relationship status: Not on file  Other Topics Concern  . Not on file   Social History Narrative  . Not on file   Family History  Problem Relation Age of Onset  . Cancer Mother   . Cancer Father    Allergies  Allergen Reactions  . Dilantin [Phenytoin] Rash   Prior to Admission medications   Medication Sig Start Date End Date Taking? Authorizing Provider  fluticasone (FLONASE) 50 MCG/ACT nasal spray Place 1 spray into both nostrils daily as needed for allergies or rhinitis.   Yes [provider]  lamoTRIgine (LAMICTAL) 200 MG tablet Take 400 mg by mouth at bedtime.   Yes [provider]  zolpidem (AMBIEN CR) 12.5 MG CR tablet Take 12.5 mg by mouth at bedtime as needed for sleep.   Yes [provider]     All other systems have been reviewed and were otherwise negative with the exception of those mentioned in the HPI and as above.  Physical Exam: There were no vitals filed for this visit.  There is no height or weight on file to calculate BMI.  General: Alert, no acute distress Cardiovascular: No pedal edema Respiratory: No cyanosis, no use of accessory musculature Skin:  No lesions in the area of chief complaint Neurologic: Sensation intact distally Psychiatric: Patient is competent for consent with normal mood and affect Lymphatic: No axillary or cervical lymphadenopathy  MUSCULOSKELETAL: + SLR on the right  Assessment/Plan: L4/5 FORAMINAL [place] for Procedure(s): LUMBAR 4-5 DECOMPRESSION   Norva Karvonen, MD 12/12/2018 8:01 AM

## 2018-12-13 ENCOUNTER — Encounter (HOSPITAL_COMMUNITY): Payer: Self-pay | Admitting: Orthopedic Surgery

## 2020-12-23 IMAGING — CR LUMBAR SPINE - 2-3 VIEW
1 series · 1 of 1 positions shown · non-contrast
Comparison: None.

CLINICAL DATA: Localization films lumbar surgery.

EXAM:
LUMBAR SPINE - 2-3 VIEW

[lateral]
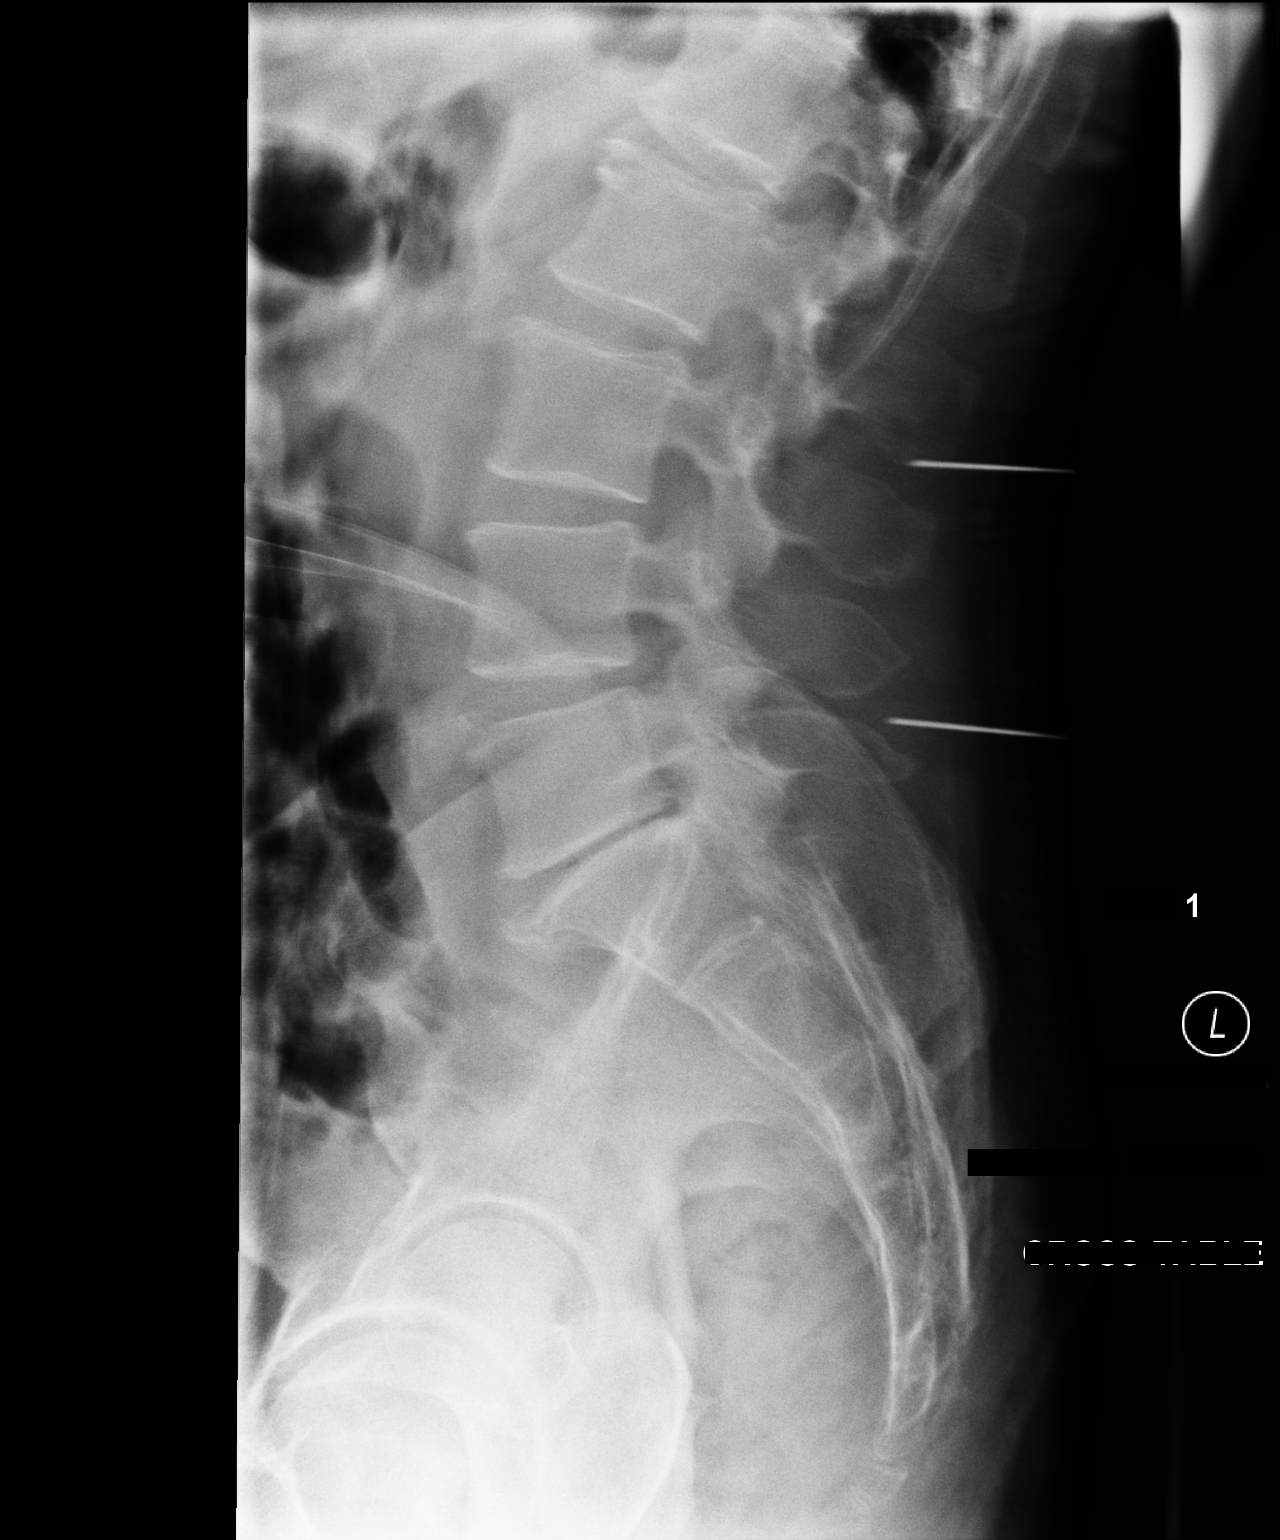

[1 of 1 positions shown; findings below may reference images not displayed]

FINDINGS: Lateral intraoperative view of the lumbar spine demonstrates mild
spondylosis with disc disease at the L5-S1 level greater than the
L4-5 level. No compression fracture or spondylolisthesis. Surgical
instruments are present over the posterior soft tissues with the
more superior metallic instrument tip between the L2 and L3 spinous
processes in the more inferior surgical instrument tip between the
spinous processes of L4 and L5.
IMPRESSION: Intraoperative lateral lumbar spine as described.
